# Patient Record
Sex: Male | Born: 1990 | Hispanic: Yes | Marital: Married | Smoking: Never smoker
Health system: Southern US, Community
[De-identification: ages and names within clinical notes are randomized; demographics above are authoritative.]

## PROBLEM LIST (undated history)

## (undated) HISTORY — PX: FRACTURE SURGERY: SHX138

---

## 2021-03-17 DIAGNOSIS — K219 Gastro-esophageal reflux disease without esophagitis: Secondary | ICD-10-CM | POA: Diagnosis not present

## 2021-03-17 DIAGNOSIS — R198 Other specified symptoms and signs involving the digestive system and abdomen: Secondary | ICD-10-CM | POA: Diagnosis not present

## 2021-03-17 DIAGNOSIS — Z8719 Personal history of other diseases of the digestive system: Secondary | ICD-10-CM | POA: Diagnosis not present

## 2021-03-17 DIAGNOSIS — R110 Nausea: Secondary | ICD-10-CM | POA: Diagnosis not present

## 2021-03-18 ENCOUNTER — Other Ambulatory Visit: Payer: Self-pay | Admitting: Gastroenterology

## 2021-03-18 DIAGNOSIS — R110 Nausea: Secondary | ICD-10-CM

## 2021-04-07 ENCOUNTER — Ambulatory Visit
Admission: RE | Admit: 2021-04-07 | Discharge: 2021-04-07 | Disposition: A | Discharge status: Home / Self Care | Payer: BC Managed Care – PPO | Source: Ambulatory Visit | Attending: Gastroenterology | Admitting: Gastroenterology

## 2021-04-07 DIAGNOSIS — R110 Nausea: Secondary | ICD-10-CM

## 2021-04-09 DIAGNOSIS — Z0000 Encounter for general adult medical examination without abnormal findings: Secondary | ICD-10-CM | POA: Diagnosis not present

## 2021-04-09 DIAGNOSIS — Z13220 Encounter for screening for lipoid disorders: Secondary | ICD-10-CM | POA: Diagnosis not present

## 2021-05-08 DIAGNOSIS — M25651 Stiffness of right hip, not elsewhere classified: Secondary | ICD-10-CM | POA: Diagnosis not present

## 2021-05-08 DIAGNOSIS — M79651 Pain in right thigh: Secondary | ICD-10-CM | POA: Diagnosis not present

## 2021-05-08 DIAGNOSIS — M6281 Muscle weakness (generalized): Secondary | ICD-10-CM | POA: Diagnosis not present

## 2021-05-08 DIAGNOSIS — M25661 Stiffness of right knee, not elsewhere classified: Secondary | ICD-10-CM | POA: Diagnosis not present

## 2021-05-13 DIAGNOSIS — M25661 Stiffness of right knee, not elsewhere classified: Secondary | ICD-10-CM | POA: Diagnosis not present

## 2021-05-13 DIAGNOSIS — M25651 Stiffness of right hip, not elsewhere classified: Secondary | ICD-10-CM | POA: Diagnosis not present

## 2021-05-13 DIAGNOSIS — M6281 Muscle weakness (generalized): Secondary | ICD-10-CM | POA: Diagnosis not present

## 2021-05-13 DIAGNOSIS — M79651 Pain in right thigh: Secondary | ICD-10-CM | POA: Diagnosis not present

## 2021-05-16 DIAGNOSIS — M25651 Stiffness of right hip, not elsewhere classified: Secondary | ICD-10-CM | POA: Diagnosis not present

## 2021-05-16 DIAGNOSIS — M25661 Stiffness of right knee, not elsewhere classified: Secondary | ICD-10-CM | POA: Diagnosis not present

## 2021-05-16 DIAGNOSIS — M79651 Pain in right thigh: Secondary | ICD-10-CM | POA: Diagnosis not present

## 2021-05-16 DIAGNOSIS — M6281 Muscle weakness (generalized): Secondary | ICD-10-CM | POA: Diagnosis not present

## 2021-05-20 DIAGNOSIS — M25661 Stiffness of right knee, not elsewhere classified: Secondary | ICD-10-CM | POA: Diagnosis not present

## 2021-05-20 DIAGNOSIS — M79651 Pain in right thigh: Secondary | ICD-10-CM | POA: Diagnosis not present

## 2021-05-20 DIAGNOSIS — M25651 Stiffness of right hip, not elsewhere classified: Secondary | ICD-10-CM | POA: Diagnosis not present

## 2021-05-20 DIAGNOSIS — M6281 Muscle weakness (generalized): Secondary | ICD-10-CM | POA: Diagnosis not present

## 2021-05-28 DIAGNOSIS — M6281 Muscle weakness (generalized): Secondary | ICD-10-CM | POA: Diagnosis not present

## 2021-05-28 DIAGNOSIS — M79651 Pain in right thigh: Secondary | ICD-10-CM | POA: Diagnosis not present

## 2021-05-28 DIAGNOSIS — M25661 Stiffness of right knee, not elsewhere classified: Secondary | ICD-10-CM | POA: Diagnosis not present

## 2021-05-28 DIAGNOSIS — M25651 Stiffness of right hip, not elsewhere classified: Secondary | ICD-10-CM | POA: Diagnosis not present

## 2021-05-30 DIAGNOSIS — M25661 Stiffness of right knee, not elsewhere classified: Secondary | ICD-10-CM | POA: Diagnosis not present

## 2021-05-30 DIAGNOSIS — M25651 Stiffness of right hip, not elsewhere classified: Secondary | ICD-10-CM | POA: Diagnosis not present

## 2021-05-30 DIAGNOSIS — M79651 Pain in right thigh: Secondary | ICD-10-CM | POA: Diagnosis not present

## 2021-05-30 DIAGNOSIS — M6281 Muscle weakness (generalized): Secondary | ICD-10-CM | POA: Diagnosis not present

## 2021-06-04 IMAGING — US US ABDOMEN COMPLETE
1 series · 14 of 25 positions shown · non-contrast
Comparison: None.

CLINICAL DATA: Nausea

EXAM:
ABDOMEN ULTRASOUND COMPLETE

[Series 1: us abdomen complete · 0.26mm/px · 14 of 92 slices shown]
[im 1/92]
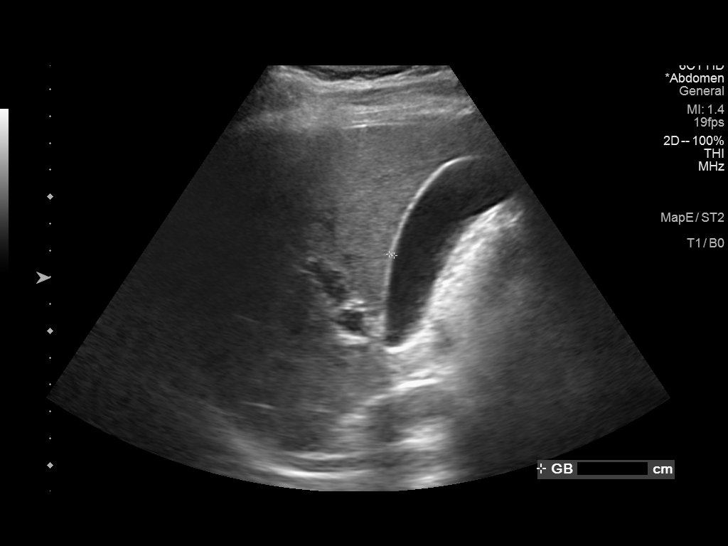
[im 8/92]
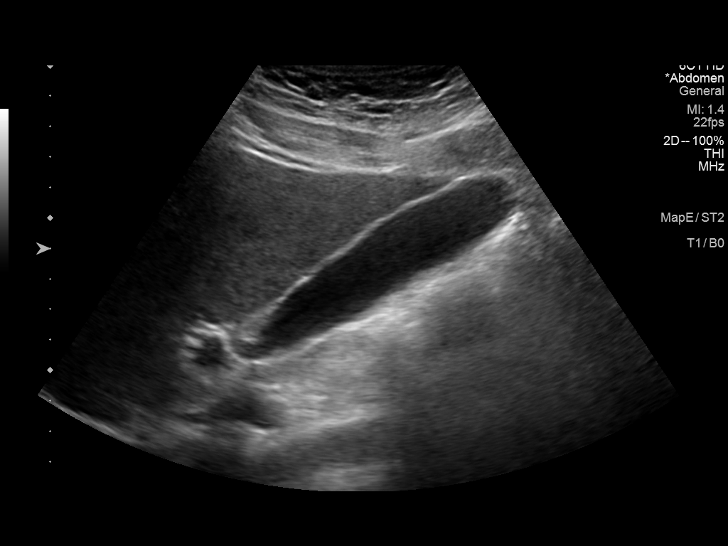
[im 16/92]
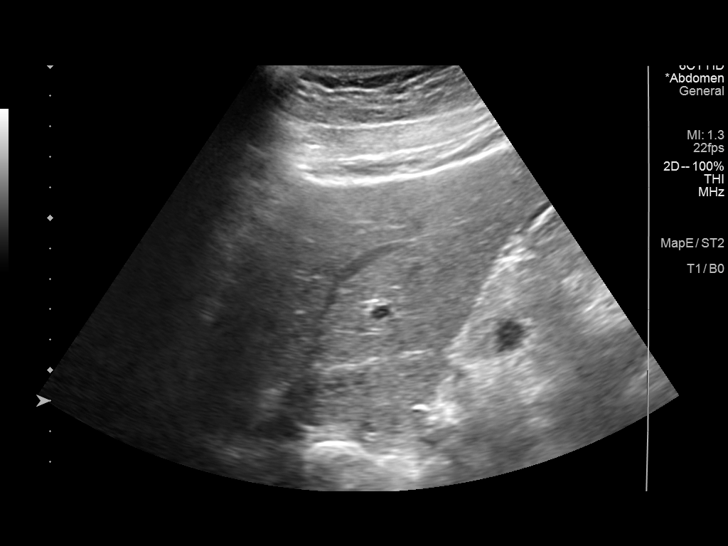
[im 23/92]
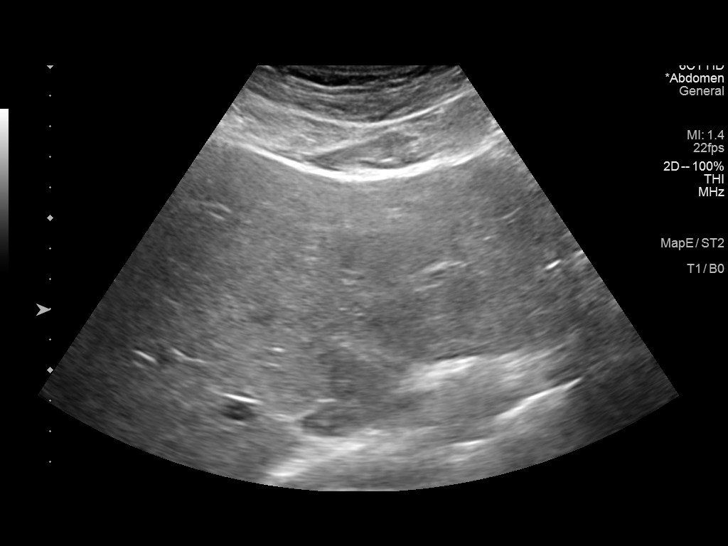
[im 31/92]
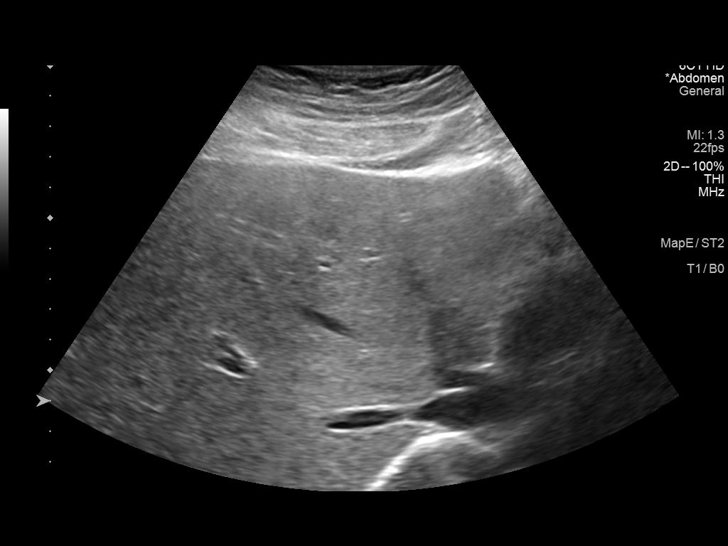
[im 35/92]
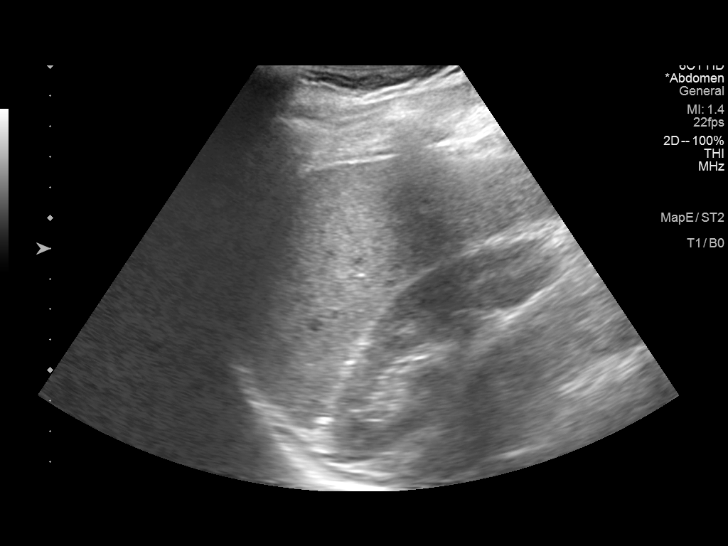
[im 42/92]
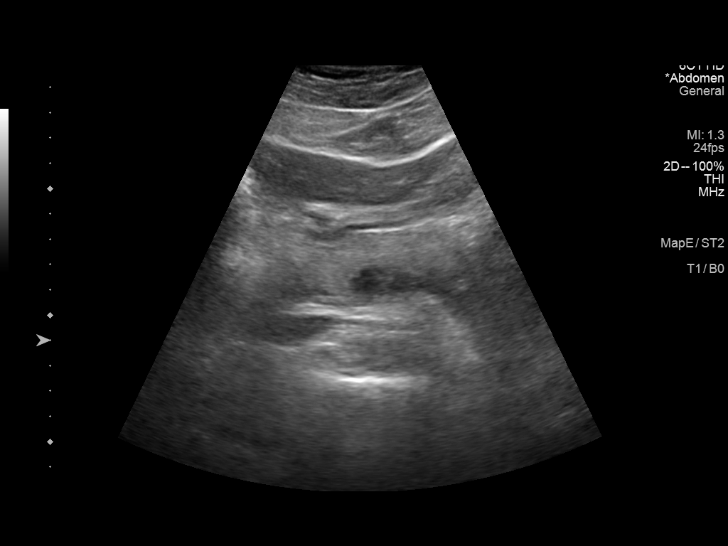
[im 50/92]
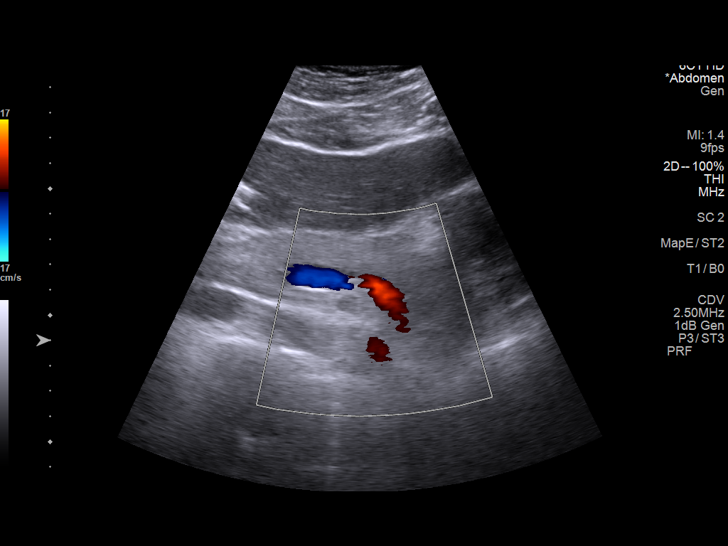
[im 57/92]
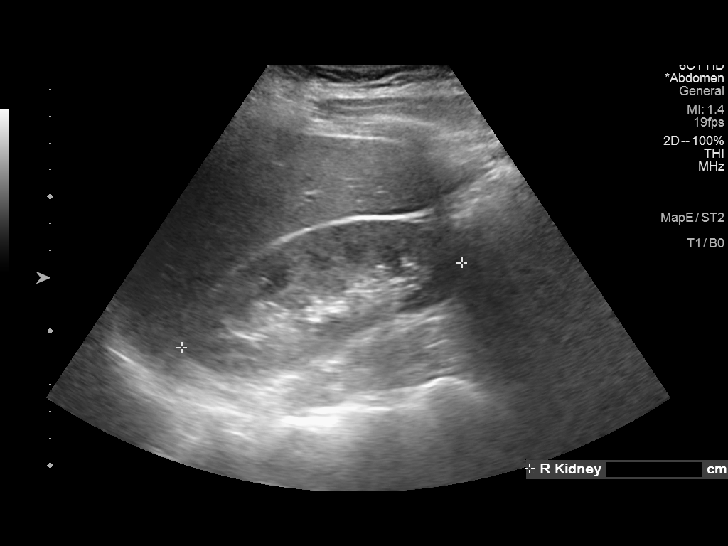
[im 61/92]
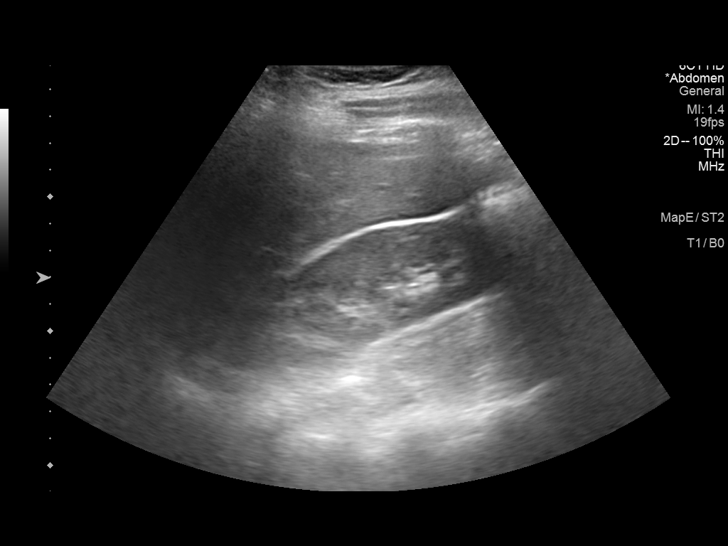
[im 69/92]
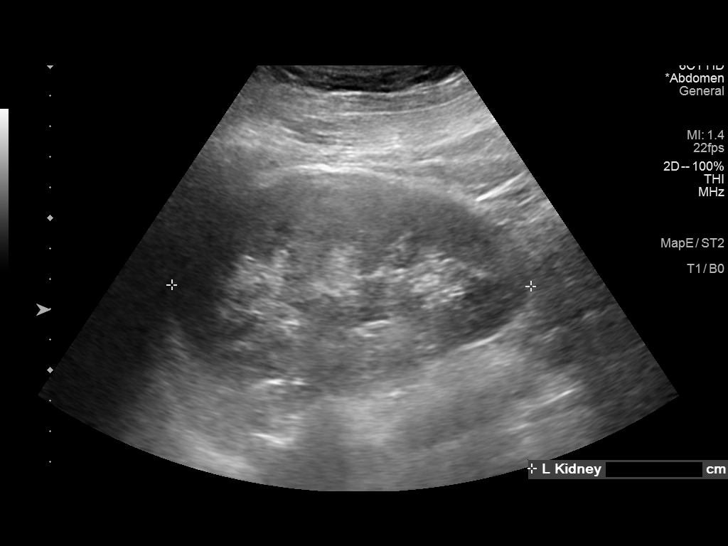
[im 76/92]
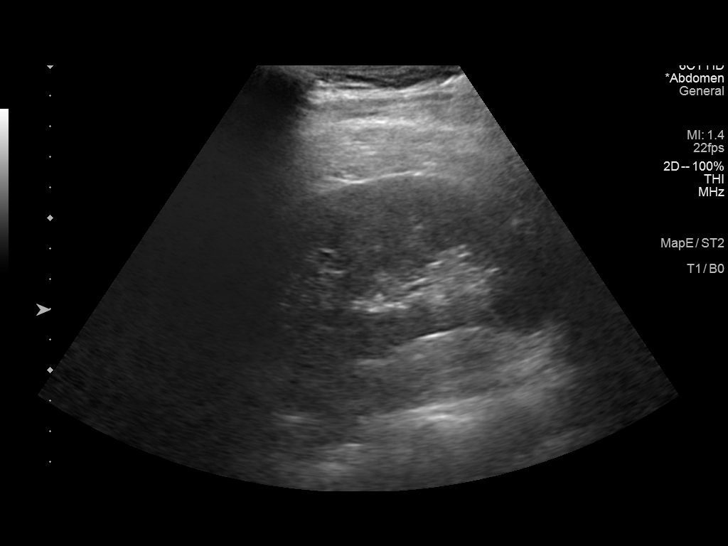
[im 84/92]
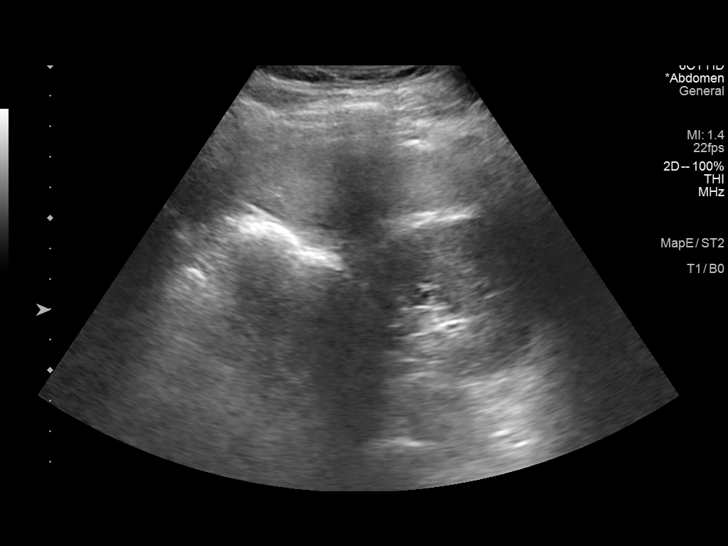
[im 92/92]
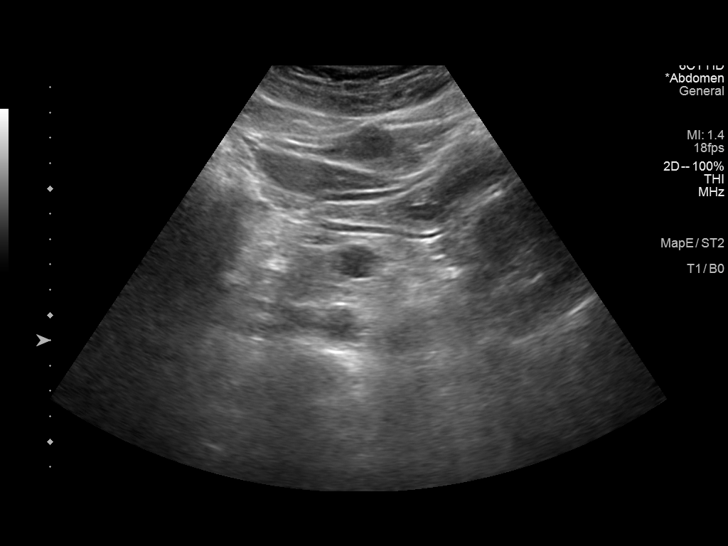

[14 of 25 positions shown; findings below may reference images not displayed]

FINDINGS: Gallbladder: No gallstones or wall thickening visualized. No
sonographic Murphy sign noted by sonographer.

Common bile duct: Diameter: 3.8 mm

Liver: No focal lesion identified. Within normal limits in
parenchymal echogenicity. Portal vein is patent on color Doppler
imaging with normal direction of blood flow towards the liver.

IVC: No abnormality visualized.

Pancreas: Visualized portion unremarkable.

Spleen: Size and appearance within normal limits.

Right Kidney: Length: 10.9 cm. Echogenicity within normal limits. No
mass or hydronephrosis visualized.

Left Kidney: Length: 11.8 cm. Echogenicity within normal limits. No
mass or hydronephrosis visualized.

Abdominal aorta: No aneurysm visualized.

Other findings: None.
IMPRESSION: Negative examination

## 2021-06-05 DIAGNOSIS — M25651 Stiffness of right hip, not elsewhere classified: Secondary | ICD-10-CM | POA: Diagnosis not present

## 2021-06-05 DIAGNOSIS — M25661 Stiffness of right knee, not elsewhere classified: Secondary | ICD-10-CM | POA: Diagnosis not present

## 2021-06-05 DIAGNOSIS — M6281 Muscle weakness (generalized): Secondary | ICD-10-CM | POA: Diagnosis not present

## 2021-06-05 DIAGNOSIS — M79651 Pain in right thigh: Secondary | ICD-10-CM | POA: Diagnosis not present

## 2021-06-06 DIAGNOSIS — M25661 Stiffness of right knee, not elsewhere classified: Secondary | ICD-10-CM | POA: Diagnosis not present

## 2021-06-06 DIAGNOSIS — M79651 Pain in right thigh: Secondary | ICD-10-CM | POA: Diagnosis not present

## 2021-06-06 DIAGNOSIS — M6281 Muscle weakness (generalized): Secondary | ICD-10-CM | POA: Diagnosis not present

## 2021-06-06 DIAGNOSIS — M25651 Stiffness of right hip, not elsewhere classified: Secondary | ICD-10-CM | POA: Diagnosis not present

## 2021-06-23 DIAGNOSIS — M25661 Stiffness of right knee, not elsewhere classified: Secondary | ICD-10-CM | POA: Diagnosis not present

## 2021-06-23 DIAGNOSIS — M6281 Muscle weakness (generalized): Secondary | ICD-10-CM | POA: Diagnosis not present

## 2021-06-23 DIAGNOSIS — M25651 Stiffness of right hip, not elsewhere classified: Secondary | ICD-10-CM | POA: Diagnosis not present

## 2021-06-23 DIAGNOSIS — M79651 Pain in right thigh: Secondary | ICD-10-CM | POA: Diagnosis not present

## 2021-06-27 DIAGNOSIS — M25661 Stiffness of right knee, not elsewhere classified: Secondary | ICD-10-CM | POA: Diagnosis not present

## 2021-06-27 DIAGNOSIS — M25651 Stiffness of right hip, not elsewhere classified: Secondary | ICD-10-CM | POA: Diagnosis not present

## 2021-06-27 DIAGNOSIS — M79651 Pain in right thigh: Secondary | ICD-10-CM | POA: Diagnosis not present

## 2021-06-27 DIAGNOSIS — M6281 Muscle weakness (generalized): Secondary | ICD-10-CM | POA: Diagnosis not present

## 2021-06-30 DIAGNOSIS — M79651 Pain in right thigh: Secondary | ICD-10-CM | POA: Diagnosis not present

## 2021-06-30 DIAGNOSIS — M25651 Stiffness of right hip, not elsewhere classified: Secondary | ICD-10-CM | POA: Diagnosis not present

## 2021-06-30 DIAGNOSIS — M6281 Muscle weakness (generalized): Secondary | ICD-10-CM | POA: Diagnosis not present

## 2021-06-30 DIAGNOSIS — M25661 Stiffness of right knee, not elsewhere classified: Secondary | ICD-10-CM | POA: Diagnosis not present

## 2021-07-07 DIAGNOSIS — M6281 Muscle weakness (generalized): Secondary | ICD-10-CM | POA: Diagnosis not present

## 2021-07-07 DIAGNOSIS — M25661 Stiffness of right knee, not elsewhere classified: Secondary | ICD-10-CM | POA: Diagnosis not present

## 2021-07-07 DIAGNOSIS — M25651 Stiffness of right hip, not elsewhere classified: Secondary | ICD-10-CM | POA: Diagnosis not present

## 2021-07-07 DIAGNOSIS — M79651 Pain in right thigh: Secondary | ICD-10-CM | POA: Diagnosis not present

## 2021-07-25 DIAGNOSIS — M6281 Muscle weakness (generalized): Secondary | ICD-10-CM | POA: Diagnosis not present

## 2021-08-25 DIAGNOSIS — R12 Heartburn: Secondary | ICD-10-CM | POA: Diagnosis not present

## 2021-08-25 DIAGNOSIS — K219 Gastro-esophageal reflux disease without esophagitis: Secondary | ICD-10-CM | POA: Diagnosis not present

## 2021-08-25 DIAGNOSIS — K921 Melena: Secondary | ICD-10-CM | POA: Diagnosis not present

## 2022-04-11 ENCOUNTER — Emergency Department
Admission: EM | Admit: 2022-04-11 | Discharge: 2022-04-11 | Disposition: A | Discharge status: Home / Self Care | Payer: BC Managed Care – PPO | Attending: Student in an Organized Health Care Education/Training Program | Admitting: Student in an Organized Health Care Education/Training Program

## 2022-04-11 ENCOUNTER — Other Ambulatory Visit: Payer: Self-pay

## 2022-04-11 ENCOUNTER — Emergency Department: Payer: BC Managed Care – PPO

## 2022-04-11 DIAGNOSIS — Y93E5 Activity, floor mopping and cleaning: Secondary | ICD-10-CM | POA: Insufficient documentation

## 2022-04-11 DIAGNOSIS — S61211A Laceration without foreign body of left index finger without damage to nail, initial encounter: Secondary | ICD-10-CM

## 2022-04-11 DIAGNOSIS — Y92000 Kitchen of unspecified non-institutional (private) residence as  the place of occurrence of the external cause: Secondary | ICD-10-CM | POA: Diagnosis not present

## 2022-04-11 DIAGNOSIS — S6992XA Unspecified injury of left wrist, hand and finger(s), initial encounter: Secondary | ICD-10-CM | POA: Diagnosis not present

## 2022-04-11 DIAGNOSIS — W290XXA Contact with powered kitchen appliance, initial encounter: Secondary | ICD-10-CM | POA: Diagnosis not present

## 2022-04-11 MED ORDER — LIDOCAINE HCL (PF) 1 % IJ SOLN
5.0000 mL | Freq: Once | INTRAMUSCULAR | Status: AC
  Administered 2022-04-11: 5 mL via INTRADERMAL
  Filled 2022-04-11: qty 5

## 2022-04-11 NOTE — ED Provider Notes (Signed)
? ?[facility] ?Provider Note ? ? ? Event Date/Time  ? First MD Initiated Contact with Patient 04/11/22 1114   ?  (approximate) ? ? ?History  ? ?Extremity Laceration ? ? ?HPI ? ?Carlos Ramos is a 31 y.o. male presents emergency department for laceration to the left index finger.  Patient was using an immersion blender when his finger got caught in a blender.  States his Tdap is up-to-date.  Denies numbness or tingling. ? ?  ? ? ?Physical Exam  ? ?Triage Vital Signs: ?ED Triage Vitals  ?Enc Vitals Group  ?   BP 04/11/22 1046 (!) 131/93  ?   Pulse Rate 04/11/22 1046 (!) 58  ?   Resp 04/11/22 1046 18  ?   Temp 04/11/22 1046 98.2 ?F (36.8 ?C)  ?   Temp Source 04/11/22 1046 Oral  ?   SpO2 04/11/22 1046 97 %  ?   Weight 04/11/22 1103 180 lb (81.6 kg)  ?   Height 04/11/22 1103 5\' 10"  (1.778 m)  ?   Head Circumference --   ?   Peak Flow --   ?   Pain Score 04/11/22 1103 8  ?   Pain Loc --   ?   Pain Edu? --   ?   Excl. in GC? --   ? ? ?Most recent vital signs: ?Vitals:  ? 04/11/22 1046  ?BP: (!) 131/93  ?Pulse: (!) 58  ?Resp: 18  ?Temp: 98.2 ?F (36.8 ?C)  ?SpO2: 97%  ? ? ? ?General: Awake, no distress.   ?CV:  Good peripheral perfusion. regular rate and  rhythm ?Resp:  Normal effort.  ?Abd:  No distention.   ?Other:  Left index finger with laceration to the distal portion, no foreign body noted, neurovascular intact ? ? ?ED Results / Procedures / Treatments  ? ?Labs ?(all labs ordered are listed, but only abnormal results are displayed) ?Labs Reviewed - No data to display ? ? ?EKG ? ? ? ? ?RADIOLOGY ?X-ray left index finger ? ? ? ?PROCEDURES: ? ? ?Marland Kitchen.Laceration Repair ? ?Date/Time: 04/11/2022 12:11 PM ?Performed by: Faythe Ghee, PA-C ?Authorized by: Faythe Ghee, PA-C  ? ?Consent:  ?  Consent obtained:  Verbal ?  Consent given by:  Patient ?  Risks, benefits, and alternatives were discussed: yes   ?  Risks discussed:  Infection, pain, retained foreign body, tendon damage, poor cosmetic result,  need for additional repair, nerve damage, poor wound healing and vascular damage ?  Alternatives discussed:  No treatment ?Universal protocol:  ?  Procedure explained and questions answered to patient or proxy's satisfaction: yes   ?  Immediately prior to procedure, a time out was called: yes   ?  Patient identity confirmed:  Verbally with patient ?Anesthesia:  ?  Anesthesia method:  Nerve block ?  Block needle gauge:  27 G ?  Block anesthetic:  Lidocaine 1% w/o epi ?  Block injection procedure:  Anatomic landmarks identified, introduced needle, incremental injection, anatomic landmarks palpated and negative aspiration for blood ?  Block outcome:  Anesthesia achieved ?Laceration details:  ?  Location:  Finger ?  Finger location:  L index finger ?  Length (cm):  2 ?Pre-procedure details:  ?  Preparation:  Patient was prepped and draped in usual sterile fashion and imaging obtained to evaluate for foreign bodies ?Exploration:  ?  Hemostasis achieved with:  Direct pressure ?  Imaging obtained: x-ray   ?  Imaging outcome: foreign  body not noted   ?  Wound exploration: wound explored through full range of motion   ?  Wound extent: no areolar tissue violation noted, no fascia violation noted, no foreign bodies/material noted, no muscle damage noted, no nerve damage noted, no tendon damage noted, no underlying fracture noted and no vascular damage noted   ?  Contaminated: no   ?Treatment:  ?  Area cleansed with:  Povidone-iodine and saline ?  Amount of cleaning:  Standard ?  Irrigation solution:  Sterile saline ?  Irrigation method:  Tap ?Skin repair:  ?  Repair method:  Sutures ?  Suture size:  5-0 ?  Suture material:  Nylon ?  Suture technique:  Simple interrupted ?  Number of sutures:  5 ?Approximation:  ?  Approximation:  Close ?Repair type:  ?  Repair type:  Simple ?Post-procedure details:  ?  Dressing:  Non-adherent dressing ?  Procedure completion:  Tolerated well, no immediate complications ? ? ?MEDICATIONS ORDERED  IN ED: ?Medications  ?lidocaine (PF) (XYLOCAINE) 1 % injection 5 mL (5 mLs Intradermal Given by Other 04/11/22 1150)  ? ? ? ?IMPRESSION / MDM / ASSESSMENT AND PLAN / ED COURSE  ?I reviewed the triage vital signs and the nursing notes. ?             ?               ? ?Differential diagnosis includes, but is not limited to, laceration, fracture, avulsion ? ?The laceration goes across the distal portion of the left index finger, no foreign body was noted, see procedure note for repair ? ?X-ray of the left index finger was independently reviewed by me.  Do not see any fractures.  This is confirmed by radiology and also stated no foreign body. ? ?See procedure note.  Area was avulsed in the center of the laceration.  I explained to the patient that the skin would grow back.  He was discharged stable condition in the care of his wife.  Suture instructions were provided. ? ? ?  ? ? ?FINAL CLINICAL IMPRESSION(S) / ED DIAGNOSES  ? ?Final diagnoses:  ?Laceration of left index finger without foreign body, nail damage status unspecified, initial encounter  ? ? ? ?Rx / DC Orders  ? ?ED Discharge Orders   ? ? None  ? ?  ? ? ? ?Note:  This document was prepared using Dragon voice recognition software and may include unintentional dictation errors. ? ?  ?Versie Starks, PA-C ?04/11/22 1213 ? ?  ?Merlyn Lot, MD ?04/11/22 1238 ? ?

## 2022-04-11 NOTE — ED Notes (Signed)
Patient declined discharge vital signs. E-signature pad not functional. ?

## 2022-04-11 NOTE — Discharge Instructions (Signed)
Follow-up with your regular doctor, urgent care, or you may return emergency department for suture removal.  Keep the area as dry as possible.  Wear the bandage for 24 hours.  Return for any sign of infection. ?

## 2022-04-11 NOTE — ED Triage Notes (Signed)
Pt to ED POV with partner for laceration to L index finger that occurred this morning while cleaning his new immersion blender. ? ?Finger was wrapped by first nurse, bleeding is controlled and pt is holding pressure on it. States the laceration looked deep.  ? ?States no other fingers were injured and is UTD on Tdap. Pain 8/10. ? ?

## 2022-04-24 DIAGNOSIS — Z4802 Encounter for removal of sutures: Secondary | ICD-10-CM | POA: Diagnosis not present

## 2022-06-08 IMAGING — DX DG FINGER INDEX 2+V*L*
3 series · 3 of 3 positions shown · non-contrast
Comparison: None Available.

CLINICAL DATA: Laceration to the left index finger this morning.

EXAM:
LEFT INDEX FINGER 2+V

[finger ap]
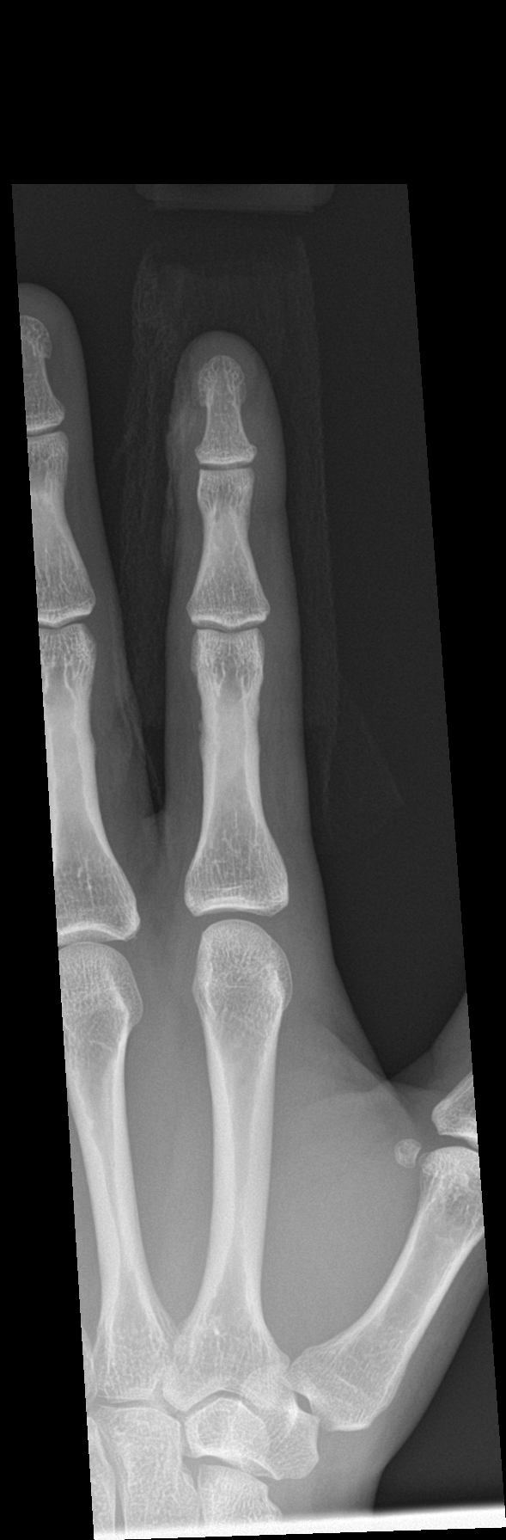

[finger obl]
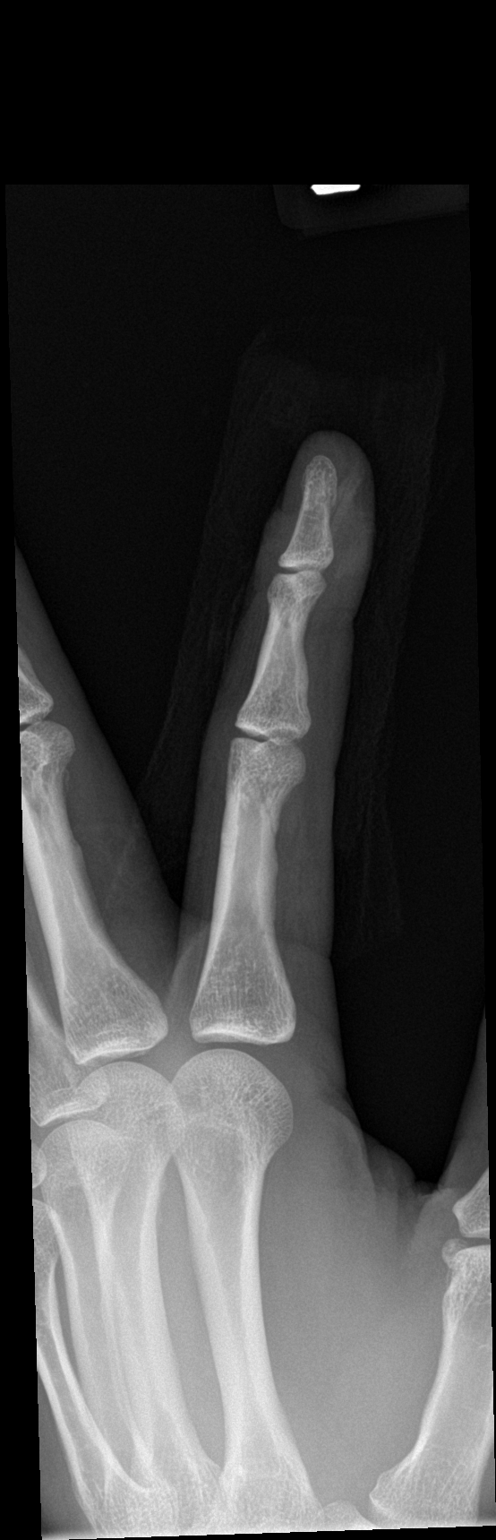

[finger lat]
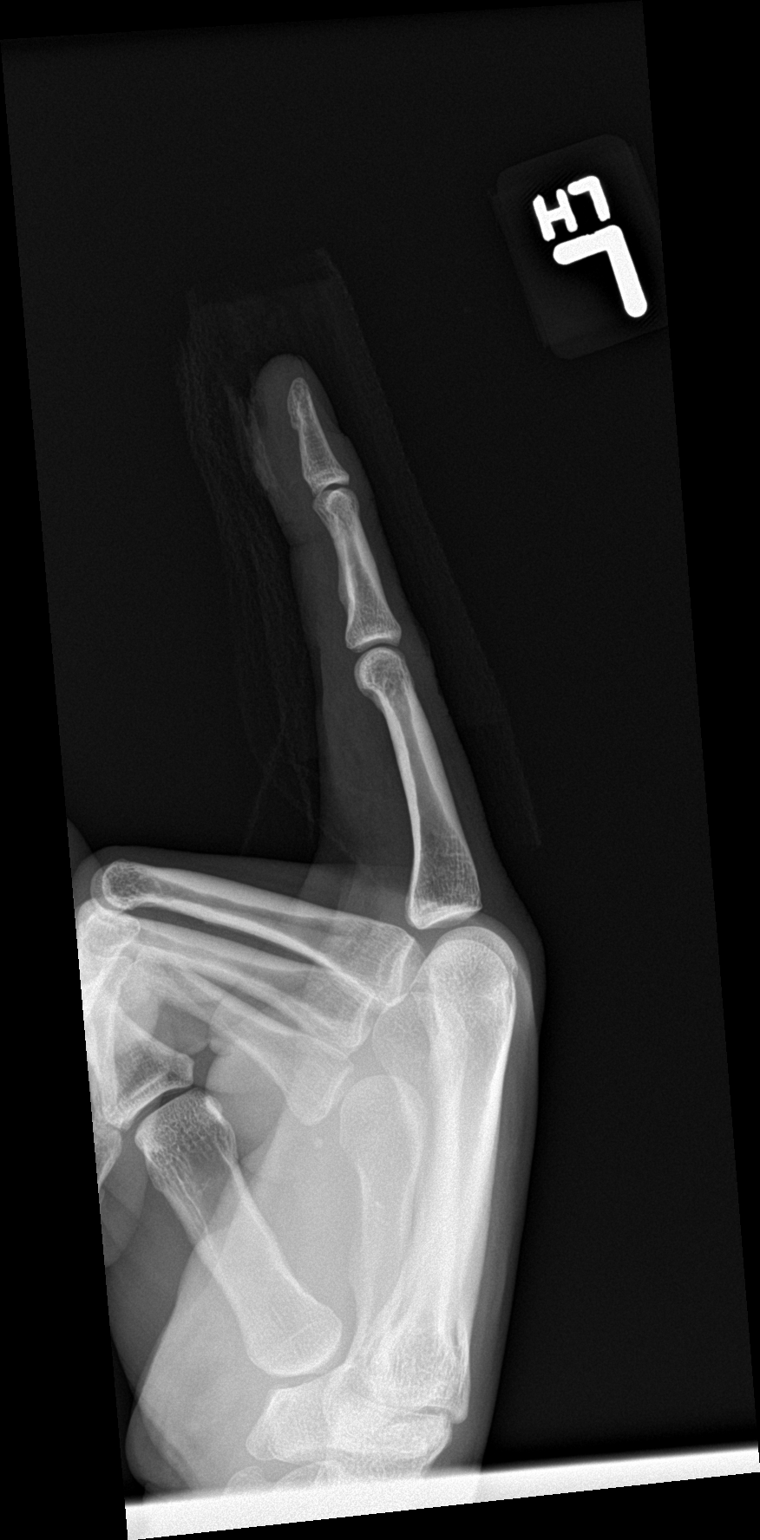

[3 of 3 positions shown; findings below may reference images not displayed]

FINDINGS: There is no evidence of fracture or dislocation. There is no
evidence of arthropathy or other focal bone abnormality. Soft tissue
defect at the distal aspect of the finger. No radiopaque foreign
body.
IMPRESSION: No acute fracture or dislocation of the left index finger. No
radiopaque foreign body.

## 2023-01-19 DIAGNOSIS — H9311 Tinnitus, right ear: Secondary | ICD-10-CM | POA: Diagnosis not present

## 2023-01-19 DIAGNOSIS — E782 Mixed hyperlipidemia: Secondary | ICD-10-CM | POA: Diagnosis not present

## 2023-01-19 DIAGNOSIS — K219 Gastro-esophageal reflux disease without esophagitis: Secondary | ICD-10-CM | POA: Diagnosis not present

## 2023-01-19 DIAGNOSIS — M79671 Pain in right foot: Secondary | ICD-10-CM | POA: Diagnosis not present

## 2023-01-19 DIAGNOSIS — Z0000 Encounter for general adult medical examination without abnormal findings: Secondary | ICD-10-CM | POA: Diagnosis not present

## 2024-02-16 DIAGNOSIS — Z6825 Body mass index (BMI) 25.0-25.9, adult: Secondary | ICD-10-CM | POA: Diagnosis not present

## 2024-02-16 DIAGNOSIS — Z0000 Encounter for general adult medical examination without abnormal findings: Secondary | ICD-10-CM | POA: Diagnosis not present

## 2024-02-16 DIAGNOSIS — K219 Gastro-esophageal reflux disease without esophagitis: Secondary | ICD-10-CM | POA: Diagnosis not present

## 2024-02-16 DIAGNOSIS — E782 Mixed hyperlipidemia: Secondary | ICD-10-CM | POA: Diagnosis not present

## 2024-05-10 DIAGNOSIS — Z0000 Encounter for general adult medical examination without abnormal findings: Secondary | ICD-10-CM | POA: Diagnosis not present
